# Patient Record
Sex: Male | Born: 1985 | Race: Black or African American | Hispanic: No | Smoking: Current every day smoker
Health system: Southern US, Community
[De-identification: ages and names within clinical notes are randomized; demographics above are authoritative.]

---

## 2018-06-18 ENCOUNTER — Encounter (HOSPITAL_COMMUNITY): Payer: Self-pay | Admitting: Emergency Medicine

## 2018-06-18 ENCOUNTER — Emergency Department (HOSPITAL_COMMUNITY)
Admission: EM | Admit: 2018-06-18 | Discharge: 2018-06-18 | Disposition: A | Discharge status: Home / Self Care | Payer: Self-pay | Attending: Emergency Medicine | Admitting: Emergency Medicine

## 2018-06-18 ENCOUNTER — Emergency Department (HOSPITAL_COMMUNITY): Payer: Self-pay

## 2018-06-18 DIAGNOSIS — R0789 Other chest pain: Secondary | ICD-10-CM | POA: Insufficient documentation

## 2018-06-18 DIAGNOSIS — R0602 Shortness of breath: Secondary | ICD-10-CM | POA: Insufficient documentation

## 2018-06-18 DIAGNOSIS — F1210 Cannabis abuse, uncomplicated: Secondary | ICD-10-CM | POA: Insufficient documentation

## 2018-06-18 DIAGNOSIS — F17200 Nicotine dependence, unspecified, uncomplicated: Secondary | ICD-10-CM | POA: Insufficient documentation

## 2018-06-18 LAB — CBC
HEMATOCRIT: 42.4 % (ref 39.0–52.0)
Hemoglobin: 14.2 g/dL (ref 13.0–17.0)
MCH: 31.9 pg (ref 26.0–34.0)
MCHC: 33.5 g/dL (ref 30.0–36.0)
MCV: 95.3 fL (ref 80.0–100.0)
NRBC: 0 % (ref 0.0–0.2)
PLATELETS: 273 10*3/uL (ref 150–400)
RBC: 4.45 MIL/uL (ref 4.22–5.81)
RDW: 13.7 % (ref 11.5–15.5)
WBC: 5.4 10*3/uL (ref 4.0–10.5)

## 2018-06-18 LAB — BASIC METABOLIC PANEL
ANION GAP: 9 (ref 5–15)
BUN: 12 mg/dL (ref 6–20)
CALCIUM: 9.5 mg/dL (ref 8.9–10.3)
CO2: 26 mmol/L (ref 22–32)
CREATININE: 0.75 mg/dL (ref 0.61–1.24)
Chloride: 106 mmol/L (ref 98–111)
GFR calc non Af Amer: >60 mL/min (ref >60)
Glucose, Bld: 110 mg/dL — ABNORMAL HIGH (ref 70–99)
Potassium: 3.3 mmol/L — ABNORMAL LOW (ref 3.5–5.1)
Sodium: 141 mmol/L (ref 135–145)

## 2018-06-18 LAB — POCT I-STAT TROPONIN I: TROPONIN I, POC: 0 ng/mL (ref 0.00–0.08)

## 2018-06-18 LAB — I-STAT TROPONIN, ED: Troponin i, poc: 0 ng/mL (ref 0.00–0.08)

## 2018-06-18 NOTE — Discharge Instructions (Signed)
Alternate 600 mg of ibuprofen and [phone] mg of Tylenol every 3 hours as needed for pain. Do not exceed 4000 mg of Tylenol daily.  Take ibuprofen with food to avoid upset stomach issues.  You can apply ice or heat to the chest wall for 20 minutes at a time 2-3 times daily.  Do some gentle stretching of the area to avoid muscle stiffness.  Follow-up with a primary care physician for reevaluation of your symptoms.  You can call [facility] but I have also attached information for other primary care practices in the area.  Return to the emergency department if any concerning signs or symptoms develop such as high fevers, worsening chest pains, persistent shortness of breath, persistent vomiting, leg swelling, or pain with deep breaths.

## 2018-06-18 NOTE — ED Provider Notes (Signed)
[facility]-EMERGENCY DEPT Provider Note   CSN: [id number] Arrival date & time: 06/18/18  1113     History   Chief Complaint Chief Complaint  Patient presents with  . Chest Pain    HPI Dean Alexander is a 32 y.o. male with no significant past medical history presents for evaluation of acute onset, intermittent left-sided chest pain for 1 week.  He states that symptoms began while at work 1 week ago while lifting some boxes.  Since then he has been expensing pain intermittently at random intervals.  It will last for a few hours.  He describes it as a tightness and sharp stabbing pain which does not radiate.  He notes some associated shortness of breath when the pain intensifies.  The pain is not pleuritic or exertional. Denies nausea, vomiting, lightheadedness, diaphoresis, leg swelling, recent travel or surgeries, hemoptysis, prior history of DVT or PE, or hormonal placement therapy.  He smokes 6 cigarettes daily, began smoking marijuana occasionally 3 weeks ago and drinks 2 caffeinated energy drinks daily.  Denies recreational drug use.  He has taken some ibuprofen with improvement in his symptoms.  He states he has a family history of heart disease at a young age in his grandfather and uncle.  He recently moved from another city in [place] 2 months ago and had a PCP there but has not establish care with one here yet.   The history is provided by the patient.    History reviewed. No pertinent past medical history.  There are no active problems to display for this patient.   History reviewed. No pertinent surgical history.      Home Medications    Prior to Admission medications   Medication Sig Start Date End Date Taking? Authorizing Provider  ibuprofen (ADVIL,MOTRIN) 200 MG tablet Take 400 mg by mouth every 6 (six) hours as needed (For chest tightness.).   Yes [provider]    Family History No family history on file.  Social  History Social History   Tobacco Use  . Smoking status: Current Every Day Smoker  . Smokeless tobacco: Never Used  Substance Use Topics  . Alcohol use: Never    Frequency: Never  . Drug use: Never     Allergies   Patient has no known allergies.   Review of Systems Review of Systems  Constitutional: Negative for chills, diaphoresis and fever.  Respiratory: Positive for shortness of breath. Negative for cough.   Cardiovascular: Positive for chest pain. Negative for palpitations and leg swelling.  Gastrointestinal: Negative for abdominal pain, nausea and vomiting.  All other systems reviewed and are negative.    Physical Exam Updated Vital Signs BP 126/84   Pulse 75   Temp 98.1 F (36.7 C) (Oral)   Resp 16   SpO2 95%   Physical Exam  Constitutional: He appears well-developed and well-nourished. No distress.  Resting comfortably in chair  HENT:  Head: Normocephalic and atraumatic.  Eyes: Conjunctivae are normal. Right eye exhibits no discharge. Left eye exhibits no discharge.  Neck: Normal range of motion. Neck supple. No JVD present. No tracheal deviation present.  Cardiovascular: Normal rate and regular rhythm.  Pulses:      Carotid pulses are 2+ on the right side, and 2+ on the left side.      Radial pulses are 2+ on the right side, and 2+ on the left side.       Dorsalis pedis pulses are 2+ on the right side,  and 2+ on the left side.       Posterior tibial pulses are 2+ on the right side, and 2+ on the left side.  Pulmonary/Chest: Effort normal and breath sounds normal. He exhibits tenderness.  Speaking in full sentences without difficulty.  Equal rise and fall of chest.  Tenderness to palpation of the left anterior chest wall in the parasternal region.  No deformity, crepitus, ecchymosis, or flail segment noted.    Abdominal: Soft. Bowel sounds are normal. He exhibits no distension. There is no tenderness.  Musculoskeletal: He exhibits no edema.       Right  lower leg: Normal. He exhibits no tenderness and no edema.       Left lower leg: Normal. He exhibits no tenderness and no edema.  Neurological: He is alert.  Skin: Skin is warm and dry. No erythema.  Psychiatric: He has a normal mood and affect. His behavior is normal.  Nursing note and vitals reviewed.    ED Treatments / Results  Labs (all labs ordered are listed, but only abnormal results are displayed) Labs Reviewed  BASIC METABOLIC PANEL - Abnormal; Notable for the following components:      Result Value   Potassium 3.3 (*)    Glucose, Bld 110 (*)    All other components within normal limits  CBC  I-STAT TROPONIN, ED  I-STAT TROPONIN, ED  POCT I-STAT TROPONIN I    EKG EKG Interpretation  Date/Time:  Monday June 18 2018 11:25:47 EST Ventricular Rate:  84 PR Interval:    QRS Duration: 101 QT Interval:  351 QTC Calculation: 415 R Axis:   94 Text Interpretation:  Sinus rhythm Right atrial enlargement Consider right ventricular hypertrophy Confirmed by Geoffery Lyons ([id number]) on 06/18/2018 2:10:48 PM   Radiology Dg Chest 2 View  Result Date: 06/18/2018 CLINICAL DATA:  Left-sided chest pain for 2 weeks EXAM: CHEST - 2 VIEW COMPARISON:  None. FINDINGS: The heart size and mediastinal contours are within normal limits. Both lungs are clear. The visualized skeletal structures are unremarkable. IMPRESSION: No active cardiopulmonary disease. Electronically Signed   By: Alcide Clever M.D.   On: 06/18/2018 12:24    Procedures Procedures (including critical care time)  Medications Ordered in ED Medications - No data to display   Initial Impression / Assessment and Plan / ED Course  I have reviewed the triage vital signs and the nursing notes.  Pertinent labs & imaging results that were available during my care of the patient were reviewed by me and considered in my medical decision making (see chart for details).     Patient with intermittent left sided chest pains for  1 week.  He is afebrile, vital signs are stable, he is nontoxic in appearance.  Chest pain is reproducible on palpation suggest a possible musculoskeletal etiology.  EKG shows normal sinus rhythm.  Chest x-ray shows no acute cardiopulmonary abnormalities.  Lab work reviewed by me shows no significant metabolic derangements, no leukocytosis.  Creatinine within normal limits.  Serial troponins are negative. HEART score of 1.  He is overall low risk for cardiac etiology of disease and I doubt ACS/MI.  He is PERC negative and I doubt PE.  No abdominal tenderness on exam to suggest intra-abdominal source of his chest pain such as pancreatitis, gastritis, or cholecystitis.  I offered him pain medicine in the ED but he declined and stated that he was feeling well.  No evidence of cardiac tamponade, dissection, esophageal rupture, pneumothorax, or other acute  life-threatening cardiopulmonary abnormality.  No further emergent work-up required at this time.  Recommend follow-up with PCP or cardiology for reevaluation of symptoms.  Discussed strict ED return precautions.  Patient and patient's significant other verbalized understanding of and agreement with plan and patient is stable for discharge home at this time.  Final Clinical Impressions(s) / ED Diagnoses   Final diagnoses:  Atypical chest pain  Chest wall pain    ED Discharge Orders    None       Jeanie Sewer, PA-C 06/18/18 1608    Terrilee Files, MD 06/19/18 [phone]

## 2018-06-18 NOTE — ED Triage Notes (Signed)
Patient here from home with complaints of chest pain x2 weeks. Denies n/v, denies SOB. No hx.

## 2018-10-29 ENCOUNTER — Encounter (HOSPITAL_COMMUNITY): Payer: Self-pay

## 2018-10-29 ENCOUNTER — Emergency Department (HOSPITAL_COMMUNITY)
Admission: EM | Admit: 2018-10-29 | Discharge: 2018-10-29 | Disposition: A | Discharge status: Home / Self Care | Payer: Self-pay | Attending: Emergency Medicine | Admitting: Emergency Medicine

## 2018-10-29 ENCOUNTER — Other Ambulatory Visit: Payer: Self-pay

## 2018-10-29 DIAGNOSIS — Z0279 Encounter for issue of other medical certificate: Secondary | ICD-10-CM | POA: Insufficient documentation

## 2018-10-29 DIAGNOSIS — Z0289 Encounter for other administrative examinations: Secondary | ICD-10-CM

## 2018-10-29 DIAGNOSIS — F17200 Nicotine dependence, unspecified, uncomplicated: Secondary | ICD-10-CM | POA: Insufficient documentation

## 2018-10-29 NOTE — ED Notes (Signed)
EDPA Provider at bedside. KELSEY 

## 2018-10-29 NOTE — ED Provider Notes (Signed)
[facility]-EMERGENCY DEPT Provider Note   CSN: [id number] Arrival date & time: 10/29/18  1445    History   Chief Complaint Chief Complaint  Patient presents with  . Letter for School/Work    HPI Jorryn Headlee is a 33 y.o. male.     Treton Wenzinger is a 33 y.o. male who is otherwise healthy, presents to the emergency department requesting note to return to work.  He reports that Saturday and Sunday he had some generalized abdominal cramping associated with nausea and vomiting and a few loose stools.  No associated fevers, shortness of breath, cough or chest pain.  No urinary symptoms.  He reports that he spoke with his grandmother who recommended taking flour in warm water and mixing it, and then drinking this and since doing this he has been able to keep down food and water and has had no further emesis or abdominal pain.  He tried to return to work today but they told him that he would need a note prior to coming back.  He reports that he has had multiple meals with no further symptoms.  No other aggravating or alleviating factors.  No prior history of similar GI issues.  No known sick contacts.     History reviewed. No pertinent past medical history.  There are no active problems to display for this patient.   History reviewed. No pertinent surgical history.      Home Medications    Prior to Admission medications   Medication Sig Start Date End Date Taking? Authorizing Provider  ibuprofen (ADVIL,MOTRIN) 200 MG tablet Take 400 mg by mouth every 6 (six) hours as needed (For chest tightness.).    [provider]    Family History History reviewed. No pertinent family history.  Social History Social History   Tobacco Use  . Smoking status: Current Every Day Smoker  . Smokeless tobacco: Never Used  Substance Use Topics  . Alcohol use: Never    Frequency: Never  . Drug use: Never     Allergies   Patient has no known allergies.   Review  of Systems Review of Systems  Constitutional: Negative for chills and fever.  Respiratory: Negative for cough and shortness of breath.   Cardiovascular: Negative for chest pain.  Gastrointestinal: Negative for abdominal pain, diarrhea, nausea and vomiting.  All other systems reviewed and are negative.    Physical Exam Updated Vital Signs BP 130/81 (BP Location: Left Arm)   Pulse 71   Temp 98.5 F (36.9 C) (Oral)   Resp 16   SpO2 98%   Physical Exam Vitals signs and nursing note reviewed.  Constitutional:      General: He is not in acute distress.    Appearance: Normal appearance. He is well-developed and normal weight. He is not ill-appearing or diaphoretic.  HENT:     Head: Normocephalic and atraumatic.     Mouth/Throat:     Mouth: Mucous membranes are moist.     Pharynx: Oropharynx is clear.  Eyes:     General:        Right eye: No discharge.        Left eye: No discharge.     Pupils: Pupils are equal, round, and reactive to light.  Neck:     Musculoskeletal: Neck supple.  Cardiovascular:     Rate and Rhythm: Normal rate and regular rhythm.     Heart sounds: Normal heart sounds. No murmur. No friction rub. No gallop.  Pulmonary:     Effort: Pulmonary effort is normal. No respiratory distress.     Breath sounds: Normal breath sounds. No wheezing or rales.     Comments: Respirations equal and unlabored, patient able to speak in full sentences, lungs clear to auscultation bilaterally Abdominal:     General: Bowel sounds are normal. There is no distension.     Palpations: Abdomen is soft. There is no mass.     Tenderness: There is no abdominal tenderness. There is no guarding.     Comments: Abdomen soft, nondistended, nontender to palpation in all quadrants without guarding or peritoneal signs  Musculoskeletal:        General: No deformity.  Skin:    General: Skin is warm and dry.     Capillary Refill: Capillary refill takes less than 2 seconds.  Neurological:      Mental Status: He is alert and oriented to person, place, and time.     Coordination: Coordination normal.  Psychiatric:        Mood and Affect: Mood normal.        Behavior: Behavior normal.      ED Treatments / Results  Labs (all labs ordered are listed, but only abnormal results are displayed) Labs Reviewed - No data to display  EKG None  Radiology No results found.  Procedures Procedures (including critical care time)  Medications Ordered in ED Medications - No data to display   Initial Impression / Assessment and Plan / ED Course  I have reviewed the triage vital signs and the nursing notes.  Pertinent labs & imaging results that were available during my care of the patient were reviewed by me and considered in my medical decision making (see chart for details).  Patient presents requesting a note to return to work, had generalized abdominal cramping associated with nausea and vomiting over the weekend, symptoms resolved last night after using home remedy.  Attempted to return today but patient reports his boss requires a note before he can come back to work.  He has had no further symptoms.  Patient is well-appearing with normal vitals on exam he has no abdominal tenderness, lungs are clear.  No other infectious symptoms.  She will be provided note to return to work tomorrow.  Return precautions discussed.  Patient expresses understanding and agreement with plan.  Discharged home in good condition.  Final Clinical Impressions(s) / ED Diagnoses   Final diagnoses:  Encounter to obtain excuse from work    ED Discharge Orders    None       Dartha Lodge, [place] 10/29/18 1519    Virgina Norfolk, DO 10/29/18 1624

## 2018-10-29 NOTE — Discharge Instructions (Signed)
Glad your symptoms have improved on their own.  Continue to drink plenty of water, maintain bland diet for the next few days.  You are cleared to return to work tomorrow.  If you have new or worsening abdominal pain, persistent vomiting, fevers or chills or any other new or concerning symptoms return for reevaluation.

## 2018-10-29 NOTE — ED Triage Notes (Signed)
Pt reports he stayed home from work over the weekend because he had a stomach bug. Pt is feeling better now but reports that his work is requiring a noted saying that he can return to work.

## 2019-02-12 ENCOUNTER — Emergency Department (HOSPITAL_COMMUNITY)
Admission: EM | Admit: 2019-02-12 | Discharge: 2019-02-12 | Disposition: A | Discharge status: Home / Self Care | Payer: Self-pay | Attending: Emergency Medicine | Admitting: Emergency Medicine

## 2019-02-12 ENCOUNTER — Other Ambulatory Visit: Payer: Self-pay

## 2019-02-12 ENCOUNTER — Encounter (HOSPITAL_COMMUNITY): Payer: Self-pay

## 2019-02-12 ENCOUNTER — Emergency Department (HOSPITAL_COMMUNITY): Payer: Self-pay

## 2019-02-12 DIAGNOSIS — R079 Chest pain, unspecified: Secondary | ICD-10-CM

## 2019-02-12 DIAGNOSIS — F17210 Nicotine dependence, cigarettes, uncomplicated: Secondary | ICD-10-CM | POA: Insufficient documentation

## 2019-02-12 DIAGNOSIS — R0789 Other chest pain: Secondary | ICD-10-CM | POA: Insufficient documentation

## 2019-02-12 LAB — CBC
HCT: 41.1 % (ref 39.0–52.0)
Hemoglobin: 13.7 g/dL (ref 13.0–17.0)
MCH: 30.6 pg (ref 26.0–34.0)
MCHC: 33.3 g/dL (ref 30.0–36.0)
MCV: 91.9 fL (ref 80.0–100.0)
Platelets: 233 10*3/uL (ref 150–400)
RBC: 4.47 MIL/uL (ref 4.22–5.81)
RDW: 13.7 % (ref 11.5–15.5)
WBC: 6.4 10*3/uL (ref 4.0–10.5)
nRBC: 0 % (ref 0.0–0.2)

## 2019-02-12 LAB — TROPONIN I (HIGH SENSITIVITY): Troponin I (High Sensitivity): 2 ng/L (ref <18)

## 2019-02-12 LAB — BASIC METABOLIC PANEL
Anion gap: 11 (ref 5–15)
BUN: 10 mg/dL (ref 6–20)
CO2: 23 mmol/L (ref 22–32)
Calcium: 9.2 mg/dL (ref 8.9–10.3)
Chloride: 105 mmol/L (ref 98–111)
Creatinine, Ser: 0.61 mg/dL (ref 0.61–1.24)
GFR calc Af Amer: >60 mL/min (ref >60)
GFR calc non Af Amer: >60 mL/min (ref >60)
Glucose, Bld: 79 mg/dL (ref 70–99)
Potassium: 3.7 mmol/L (ref 3.5–5.1)
Sodium: 139 mmol/L (ref 135–145)

## 2019-02-12 MED ORDER — SODIUM CHLORIDE 0.9% FLUSH: 3.0000 mL | Freq: Once | INTRAVENOUS | Status: DC

## 2019-02-12 NOTE — Discharge Instructions (Signed)
You were seen in the emergency department for left-sided chest pain.  You had blood work EKG and a chest x-ray that did not show any serious findings.  You can try Tylenol and ibuprofen for your pain.  If your symptoms worsen or any other new concerning findings please return to the emergency department for reevaluation.

## 2019-02-12 NOTE — ED Triage Notes (Signed)
Patient c/o intermittent left chest pain since last night. Patient denies any SOB, nausea, diaphoresis.  Patient reports that he coughed x 1 at work had a bright red dime size spot of blood.

## 2019-02-12 NOTE — ED Provider Notes (Signed)
[facility]-EMERGENCY DEPT Provider Note   CSN: [id number] Arrival date & time: 02/12/19  1617     History   Chief Complaint Chief Complaint  Patient presents with  . Chest Pain    HPI Dean Alexander is a 33 y.o. male.  No prior history of coronary disease.  He said he started with some pain again today while at work and he coughed and saw a little bit of blood in it.  His boss told him to come here to get it checked out.  He said his chest pain is resolved since then.  No prior history of heart problems.  Denies any cocaine.  It was not associate with any fever chills shortness of breath diaphoresis nausea vomiting.  HPI: A 33 year old patient presents for evaluation of chest pain. Initial onset of pain was approximately 3-6 hours ago. The patient's chest pain is not worse with exertion. The patient's chest pain is middle- or left-sided, is not well-localized, is not described as heaviness/pressure/tightness, is not sharp and does not radiate to the arms/jaw/neck. The patient does not complain of nausea and denies diaphoresis. The patient has smoked in the past 90 days. The patient has no history of stroke, has no history of peripheral artery disease, denies any history of treated diabetes, has no relevant family history of coronary artery disease (first degree relative at less than age 34), is not hypertensive, has no history of hypercholesterolemia and does not have an elevated BMI (>=30).   The history is provided by the patient.  Chest Pain Pain location:  L chest Pain quality: throbbing   Pain radiates to:  Does not radiate Pain severity:  Moderate Onset quality:  Sudden Duration:  1 hour Timing:  Intermittent Chronicity:  New Worsened by:  Nothing Ineffective treatments: ibuprofen. Associated symptoms: no abdominal pain, no back pain, no dizziness, no fever, no headache, no heartburn, no numbness, no palpitations, no shortness of breath and no syncope   Risk  factors: male sex and smoking   Risk factors: no coronary artery disease, no diabetes mellitus and no hypertension     History reviewed. No pertinent past medical history.  There are no active problems to display for this patient.   History reviewed. No pertinent surgical history.      Home Medications    Prior to Admission medications   Medication Sig Start Date End Date Taking? Authorizing Provider  ibuprofen (ADVIL,MOTRIN) 200 MG tablet Take 400 mg by mouth every 6 (six) hours as needed (For chest tightness.).   Yes [provider]    Family History History reviewed. No pertinent family history.  Social History Social History   Tobacco Use  . Smoking status: Current Every Day Smoker    Packs/day: 0.75    Types: Cigarettes  . Smokeless tobacco: Never Used  Substance Use Topics  . Alcohol use: Never    Frequency: Never  . Drug use: Never     Allergies   Patient has no known allergies.   Review of Systems Review of Systems  Constitutional: Negative for fever.  HENT: Negative for sore throat.   Eyes: Negative for visual disturbance.  Respiratory: Negative for shortness of breath.   Cardiovascular: Positive for chest pain. Negative for palpitations and syncope.  Gastrointestinal: Negative for abdominal pain and heartburn.  Genitourinary: Negative for dysuria.  Musculoskeletal: Negative for back pain.  Skin: Negative for rash.  Neurological: Negative for dizziness, numbness and headaches.     Physical Exam Updated  Vital Signs BP (!) 131/91 (BP Location: Left Arm)   Pulse 61   Temp 98.4 F (36.9 C) (Oral)   Resp 15   Ht 5\' 9"  (1.753 m)   Wt 79.4 kg   SpO2 100%   BMI 25.84 kg/m   Physical Exam Vitals signs and nursing note reviewed.  Constitutional:      Appearance: He is well-developed.  HENT:     Head: Normocephalic and atraumatic.  Eyes:     Conjunctiva/sclera: Conjunctivae normal.  Neck:     Musculoskeletal: Neck supple.   Cardiovascular:     Rate and Rhythm: Normal rate and regular rhythm.     Heart sounds: Normal heart sounds. No murmur.  Pulmonary:     Effort: Pulmonary effort is normal. No respiratory distress.     Breath sounds: Normal breath sounds.  Abdominal:     Palpations: Abdomen is soft.     Tenderness: There is no abdominal tenderness.  Musculoskeletal:     Right lower leg: He exhibits no tenderness. No edema.     Left lower leg: He exhibits no tenderness. No edema.  Skin:    General: Skin is warm and dry.     Capillary Refill: Capillary refill takes less than 2 seconds.  Neurological:     General: No focal deficit present.     Mental Status: He is alert and oriented to person, place, and time.      ED Treatments / Results  Labs (all labs ordered are listed, but only abnormal results are displayed) Labs Reviewed  BASIC METABOLIC PANEL  CBC  TROPONIN I (HIGH SENSITIVITY)    EKG EKG Interpretation  Date/Time:  Tuesday February 12 2019 16:31:19 EDT Ventricular Rate:  66 PR Interval:    QRS Duration: 106 QT Interval:  398 QTC Calculation: 417 R Axis:   118 Text Interpretation:  Sinus rhythm Probable right ventricular hypertrophy ST elev, probable normal early repol pattern similar to prior 11/19 Confirmed by Meridee ScoreButler, Vaishnav Demartin [phone]([id number]) on 02/12/2019 4:40:08 PM   Radiology Dg Chest 2 View  Result Date: 02/12/2019 CLINICAL DATA:  Patient with intermittent chest pain. EXAM: CHEST - 2 VIEW COMPARISON:  Chest radiograph 06/18/2018 FINDINGS: Normal cardiac and mediastinal contours. No consolidative pulmonary opacities. No pleural effusion or pneumothorax. Regional skeleton is unremarkable. IMPRESSION: No active cardiopulmonary disease. Electronically Signed   By: Annia Beltrew  Davis M.D.   On: 02/12/2019 18:14    Procedures Procedures (including critical care time)  Medications Ordered in ED Medications  sodium chloride flush (NS) 0.9 % injection 3 mL (has no administration in time range)      Initial Impression / Assessment and Plan / ED Course  I have reviewed the triage vital signs and the nursing notes.  Pertinent labs & imaging results that were available during my care of the patient were reviewed by me and considered in my medical decision making (see chart for details).  Clinical Course as of Feb 11 2326  Tue Feb 12, 2019  [id number] 33 year old male with no significant past medical history here with 2 episodes of chest pain.  He has a benign exam here other than being mildly hypertensive unremarkable vitals.  EKG is unremarkable.  Troponin is 2.  Hear score is 1.  Patient is pain-free now and also the utility of getting a second troponin on him right now.  Will discharge and have him follow-up PCP and return if any worsening symptoms.   [MB]    Clinical Course User Index [MB]  Hayden Rasmussen, MD    HEAR Score: 1 Final Clinical Impressions(s) / ED Diagnoses   Final diagnoses:  Nonspecific chest pain    ED Discharge Orders    None       Hayden Rasmussen, MD 02/12/19 2327

## 2021-05-18 ENCOUNTER — Emergency Department (HOSPITAL_COMMUNITY)
Admission: EM | Admit: 2021-05-18 | Discharge: 2021-05-19 | Disposition: A | Discharge status: Home / Self Care | Payer: Self-pay | Attending: Emergency Medicine | Admitting: Emergency Medicine

## 2021-05-18 ENCOUNTER — Other Ambulatory Visit: Payer: Self-pay

## 2021-05-18 ENCOUNTER — Encounter (HOSPITAL_COMMUNITY): Payer: Self-pay | Admitting: Emergency Medicine

## 2021-05-18 DIAGNOSIS — Z5321 Procedure and treatment not carried out due to patient leaving prior to being seen by health care provider: Secondary | ICD-10-CM | POA: Insufficient documentation

## 2021-05-18 DIAGNOSIS — K0889 Other specified disorders of teeth and supporting structures: Secondary | ICD-10-CM | POA: Insufficient documentation

## 2021-05-18 NOTE — ED Triage Notes (Signed)
Patient reports worsening left upper and lower dental pain unrelieved by OTC pain medications , denies fever /no oral swelling.

## 2021-05-18 NOTE — ED Provider Notes (Signed)
Emergency Medicine Provider Triage Evaluation Note  Dean Alexander , a 35 y.o. male  was evaluated in triage.  Pt complains of dental pain to the left upper mouth and left lower mouth. Denies fevers.  Review of Systems  Positive: Dental pain Negative: fevers  Physical Exam  BP (!) 141/101   Pulse 66   Temp 98.6 F (37 C)   Resp 18   SpO2 100%  Gen:   Awake, no distress   Resp:  Normal effort  MSK:   Moves extremities without difficulty Other:  No trismus or facial swelling  Medical Decision Making  Medically screening exam initiated at 8:42 PM.  Appropriate orders placed.  Dean Alexander was informed that the remainder of the evaluation will be completed by another provider, this initial triage assessment does not replace that evaluation, and the importance of remaining in the ED until their evaluation is complete.     Rayne Du 05/18/21 2042    Maia Plan, MD 05/18/21 2207

## 2021-05-19 NOTE — ED Notes (Signed)
PT called X5 no answer. 

## 2021-05-31 ENCOUNTER — Emergency Department (HOSPITAL_COMMUNITY)
Admission: EM | Admit: 2021-05-31 | Discharge: 2021-05-31 | Disposition: A | Discharge status: Home / Self Care | Payer: Self-pay | Attending: Emergency Medicine | Admitting: Emergency Medicine

## 2021-05-31 DIAGNOSIS — K029 Dental caries, unspecified: Secondary | ICD-10-CM | POA: Insufficient documentation

## 2021-05-31 DIAGNOSIS — K0889 Other specified disorders of teeth and supporting structures: Secondary | ICD-10-CM

## 2021-05-31 DIAGNOSIS — F17210 Nicotine dependence, cigarettes, uncomplicated: Secondary | ICD-10-CM | POA: Insufficient documentation

## 2021-05-31 DIAGNOSIS — K047 Periapical abscess without sinus: Secondary | ICD-10-CM | POA: Insufficient documentation

## 2021-05-31 MED ORDER — AMOXICILLIN 500 MG PO CAPS: 500.0000 mg | ORAL_CAPSULE | Freq: Three times a day (TID) | ORAL | 0 refills | Status: AC

## 2021-05-31 MED ORDER — LIDOCAINE VISCOUS HCL 2 % MT SOLN: 15.0000 mL | OROMUCOSAL | 0 refills | Status: AC | PRN

## 2021-05-31 NOTE — ED Provider Notes (Signed)
[facility] EMERGENCY DEPARTMENT Provider Note   CSN: [id number] Arrival date & time: 05/31/21  0857     History Chief Complaint  Patient presents with   Dental Pain    Dean Alexander is a 35 y.o. male presenting for evaluation of L sided dental pain.   Patient presented for evaluation of 2-week history of dental pain.  Pain is on the upper and lowers posterior teeth on the left side.  It is constant, gets better with Motrin.  No dental care.  No fevers or chills.  No difficulty opening his mouth.  No trouble swallowing.    HPI     No past medical history on file.  There are no problems to display for this patient.   No past surgical history on file.     No family history on file.  Social History   Tobacco Use   Smoking status: Every Day    Packs/day: 0.75    Types: Cigarettes   Smokeless tobacco: Never  Vaping Use   Vaping Use: Never used  Substance Use Topics   Alcohol use: Never   Drug use: Never    Home Medications Prior to Admission medications   Medication Sig Start Date End Date Taking? Authorizing Provider  amoxicillin (AMOXIL) 500 MG capsule Take 1 capsule (500 mg total) by mouth 3 (three) times daily. 05/31/21  Yes Andros Channing, PA-C  lidocaine (XYLOCAINE) 2 % solution Use as directed 15 mLs in the mouth or throat as needed for mouth pain. 05/31/21  Yes Shanikia Kernodle, PA-C  ibuprofen (ADVIL,MOTRIN) 200 MG tablet Take 400 mg by mouth every 6 (six) hours as needed (For chest tightness.).    [provider]    Allergies    Patient has no known allergies.  Review of Systems   Review of Systems  Constitutional:  Negative for fever.  HENT:  Positive for dental problem.    Physical Exam Updated Vital Signs BP (!) 109/91 (BP Location: Left Arm)   Pulse 83   Temp 98.7 F (37.1 C) (Oral)   Resp 17   SpO2 100%   Physical Exam Vitals and nursing note reviewed.  Constitutional:      General: He is not in acute  distress.    Appearance: He is well-developed.  HENT:     Head: Normocephalic and atraumatic.     Mouth/Throat:     Dentition: Dental tenderness and dental caries present.     Comments: Tenderness palpation of left upper and lower teeth.  Diffuse gingival edema.  Lots of plaque noted, overall poor dentition. Eyes:     Extraocular Movements: Extraocular movements intact.  Cardiovascular:     Rate and Rhythm: Normal rate.  Pulmonary:     Effort: Pulmonary effort is normal.  Abdominal:     General: There is no distension.  Musculoskeletal:        General: Normal range of motion.     Cervical back: Normal range of motion.  Skin:    General: Skin is warm.     Findings: No rash.  Neurological:     Mental Status: He is alert and oriented to person, place, and time.    ED Results / Procedures / Treatments   Labs (all labs ordered are listed, but only abnormal results are displayed) Labs Reviewed - No data to display  EKG None  Radiology No results found.  Procedures Procedures   Medications Ordered in ED Medications - No data to display  ED Course  I have reviewed the triage vital signs and the nursing notes.  Pertinent labs & imaging results that were available during my care of the patient were reviewed by me and considered in my medical decision making (see chart for details).    MDM Rules/Calculators/A&P                           Patient presenting for evaluation of dental pain.  On exam, patient appears nontoxic.  Pain worsened over the past 2 weeks.  He has overall poor dentition, consider dental infection versus gingivitis. No signs of Ludwigs angina or deep space infection.  Will treat with antibiotics, have patient follow-up with dentistry, resources given.  At this time, patient appears safe for discharge.  Return precautions given.  Patient states he understands and agrees to plan  Final Clinical Impression(s) / ED Diagnoses Final diagnoses:  Pain, dental   Dental infection    Rx / DC Orders ED Discharge Orders          Ordered    amoxicillin (AMOXIL) 500 MG capsule  3 times daily        05/31/21 0926    lidocaine (XYLOCAINE) 2 % solution  As needed        05/31/21 0926             Alveria Apley, PA-C 05/31/21 1012    Mancel Bale, MD 05/31/21 1800

## 2021-05-31 NOTE — Discharge Instructions (Signed)
Take antibiotics as prescribed.  Take entire course, even if symptoms improve. Take ibuprofen 3 times a day with meals as needed for pain. Take 3 tablets (600 mg) at a time.  Do not take other anti-inflammatories at the same time (Advil, Motrin, naproxen, Aleve). You may supplement with Tylenol if you need further pain control. Use viscous lidocaine to help with pain control. I recommend you follow-up with a dentist for further evaluation of your teeth.  You may follow-up with the clinic listed below or look through the paperwork for other clinics in the area. Return to the emergency room if you develop high fevers, inability to open up your mouth, inability to swallow your own spit, or any new, worsening, or concerning symptoms

## 2021-05-31 NOTE — ED Triage Notes (Signed)
Pt. Stated, Dean Alexander had a toothache for 3 weeks.

## 2022-01-07 ENCOUNTER — Emergency Department (HOSPITAL_COMMUNITY)
Admission: EM | Admit: 2022-01-07 | Discharge: 2022-01-07 | Disposition: A | Discharge status: Home / Self Care | Payer: Self-pay | Attending: Emergency Medicine | Admitting: Emergency Medicine

## 2022-01-07 ENCOUNTER — Other Ambulatory Visit: Payer: Self-pay

## 2022-01-07 ENCOUNTER — Emergency Department (HOSPITAL_COMMUNITY): Payer: Self-pay

## 2022-01-07 ENCOUNTER — Encounter (HOSPITAL_COMMUNITY): Payer: Self-pay

## 2022-01-07 DIAGNOSIS — Z5321 Procedure and treatment not carried out due to patient leaving prior to being seen by health care provider: Secondary | ICD-10-CM | POA: Insufficient documentation

## 2022-01-07 DIAGNOSIS — R079 Chest pain, unspecified: Secondary | ICD-10-CM | POA: Insufficient documentation

## 2022-01-07 LAB — BASIC METABOLIC PANEL
Anion gap: 9 (ref 5–15)
BUN: 6 mg/dL (ref 6–20)
CO2: 24 mmol/L (ref 22–32)
Calcium: 9.3 mg/dL (ref 8.9–10.3)
Chloride: 103 mmol/L (ref 98–111)
Creatinine, Ser: 0.74 mg/dL (ref 0.61–1.24)
GFR, Estimated: >60 mL/min (ref >60)
Glucose, Bld: 89 mg/dL (ref 70–99)
Potassium: 3.2 mmol/L — ABNORMAL LOW (ref 3.5–5.1)
Sodium: 136 mmol/L (ref 135–145)

## 2022-01-07 LAB — CBC
HCT: 41.2 % (ref 39.0–52.0)
Hemoglobin: 14 g/dL (ref 13.0–17.0)
MCH: 31.1 pg (ref 26.0–34.0)
MCHC: 34 g/dL (ref 30.0–36.0)
MCV: 91.6 fL (ref 80.0–100.0)
Platelets: 275 10*3/uL (ref 150–400)
RBC: 4.5 MIL/uL (ref 4.22–5.81)
RDW: 13.7 % (ref 11.5–15.5)
WBC: 7.5 10*3/uL (ref 4.0–10.5)
nRBC: 0 % (ref 0.0–0.2)

## 2022-01-07 LAB — TROPONIN I (HIGH SENSITIVITY): Troponin I (High Sensitivity): 4 ng/L (ref <18)

## 2022-01-07 NOTE — ED Provider Triage Note (Signed)
Emergency Medicine Provider Triage Evaluation Note  Meshulem Onorato , a 36 y.o. male  was evaluated in triage.  Pt complains of CP. Report intermittent L sided cp for the past 2 days.  Described as tightness, non radiating.  No fever, chills, cough, lightheadedness, dizziness, nausea, or diaphoresis.  Tobacco use.  No cardiac hx  Review of Systems  Positive: As above Negative: As above  Physical Exam  BP (!) 146/103 (BP Location: Right Arm)   Pulse 93   Temp 98.7 F (37.1 C) (Oral)   Resp 16   Ht 5\' 9"  (1.753 m)   Wt 81.6 kg   SpO2 100%   BMI 26.58 kg/m  Gen:   Awake, no distress   Resp:  Normal effort  MSK:   Moves extremities without difficulty  Other:    Medical Decision Making  Medically screening exam initiated at 6:03 PM.  Appropriate orders placed.  Dariyon Urquilla was informed that the remainder of the evaluation will be completed by another provider, this initial triage assessment does not replace that evaluation, and the importance of remaining in the ED until their evaluation is complete.     Charleen Kirks, PA-C 01/07/22 1806

## 2022-01-07 NOTE — ED Triage Notes (Signed)
Reports left sided chest pain x 3 days.  Reports not reproducible.  Described as tight.

## 2022-11-16 IMAGING — CR DG CHEST 1V
1 series · 1 of 1 positions shown · non-contrast
Comparison: Chest x-ray 06/16/2018

CLINICAL DATA: Left-sided chest pain.

EXAM:
CHEST  1 VIEW

[chest pa]
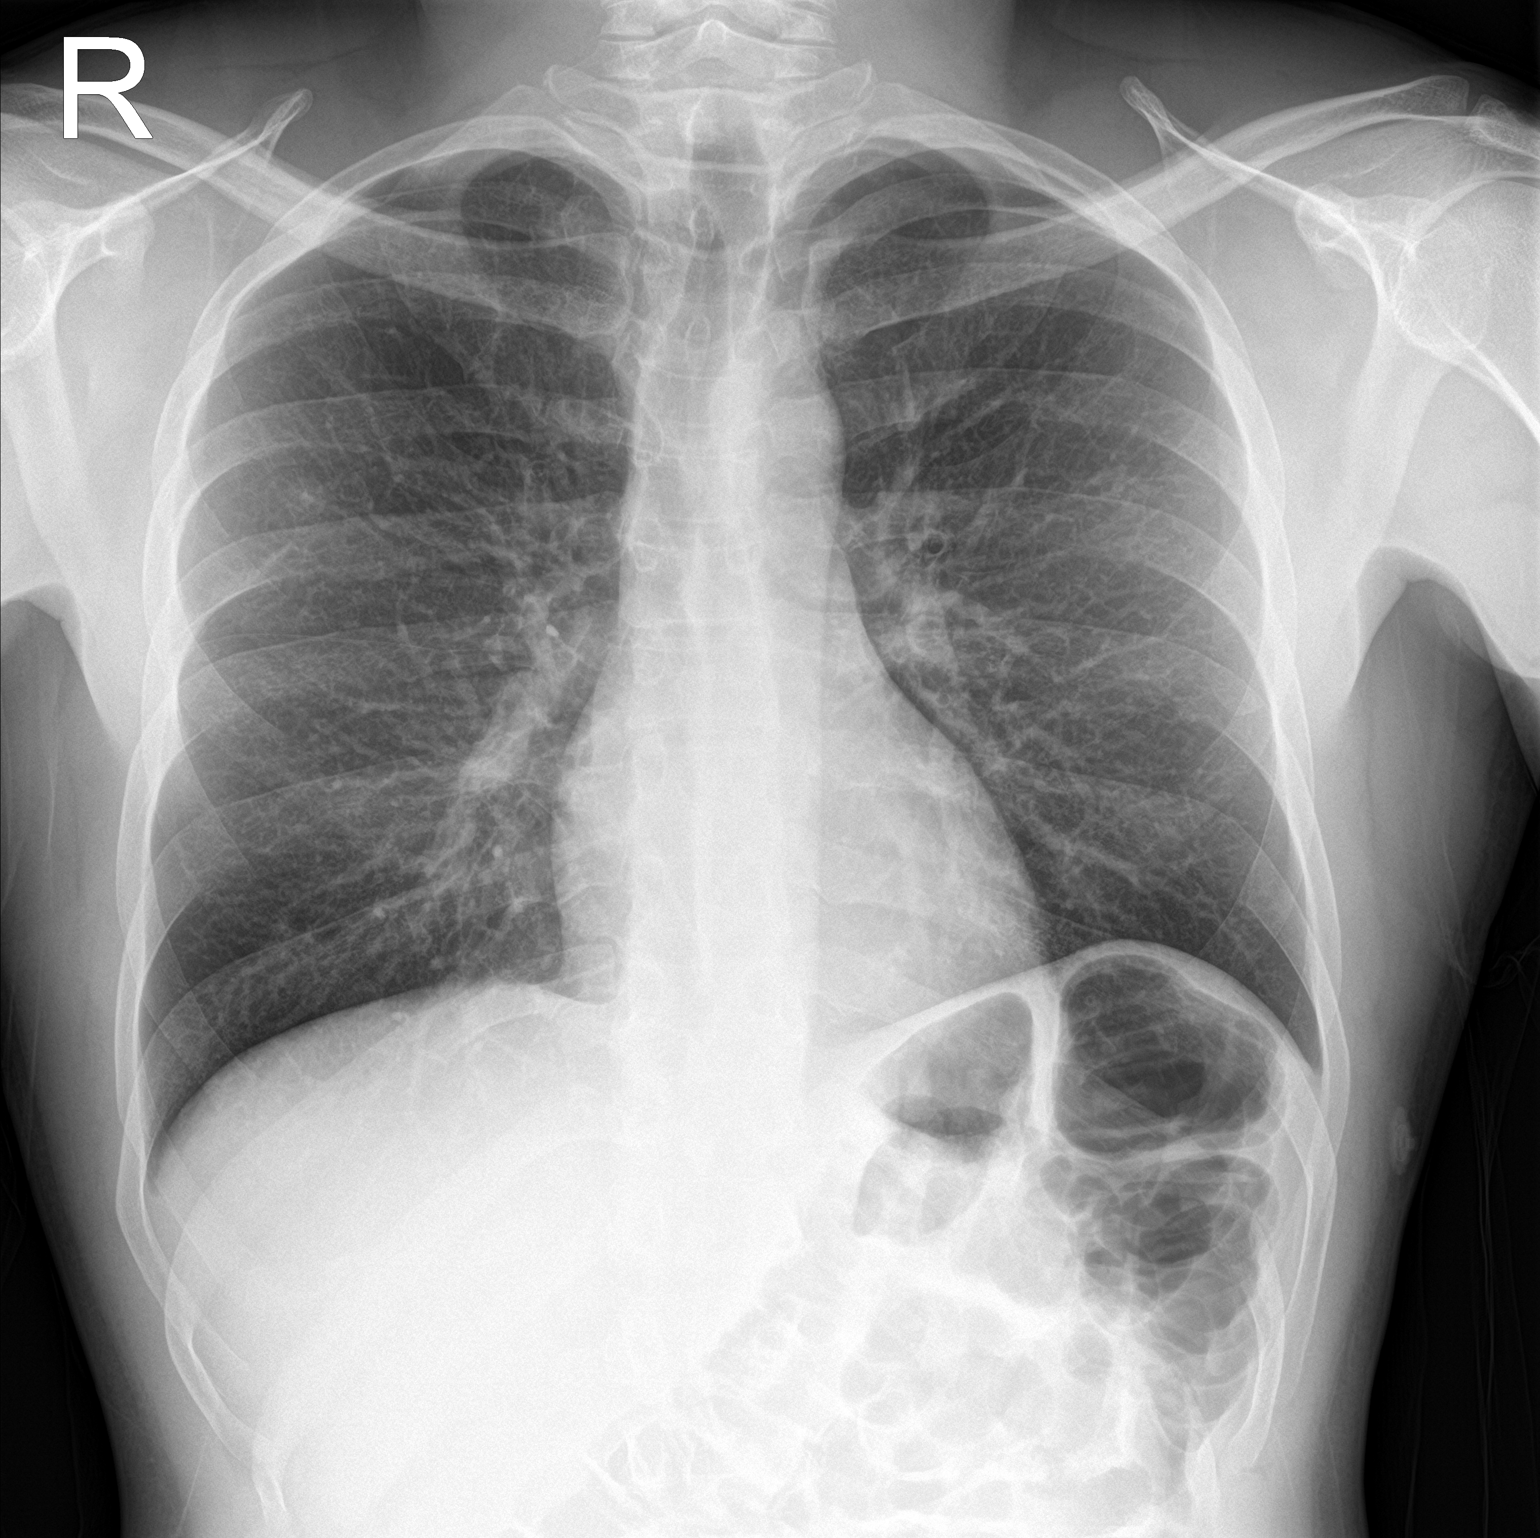

[1 of 1 positions shown; findings below may reference images not displayed]

FINDINGS: The heart size and mediastinal contours are within normal limits.
Both lungs are clear. The visualized skeletal structures are
unremarkable.
IMPRESSION: No active disease.
